# Patient Record
Sex: Male | Born: 1940 | Race: White | Hispanic: No | Marital: Married
Health system: Southern US, Community
[De-identification: ages and names within clinical notes are randomized; demographics above are authoritative.]

---

## 2004-01-10 ENCOUNTER — Encounter: Payer: Self-pay | Admitting: Internal Medicine

## 2004-01-26 ENCOUNTER — Ambulatory Visit: Payer: Self-pay

## 2004-02-10 ENCOUNTER — Encounter: Payer: Self-pay | Admitting: Internal Medicine

## 2004-03-02 ENCOUNTER — Ambulatory Visit: Payer: Self-pay

## 2004-03-11 ENCOUNTER — Encounter: Payer: Self-pay | Admitting: Internal Medicine

## 2004-03-19 ENCOUNTER — Ambulatory Visit: Payer: Self-pay | Admitting: Podiatry

## 2004-04-11 ENCOUNTER — Encounter: Payer: Self-pay | Admitting: Internal Medicine

## 2004-05-20 ENCOUNTER — Ambulatory Visit: Payer: Self-pay

## 2005-06-14 ENCOUNTER — Ambulatory Visit: Payer: Self-pay | Admitting: Specialist

## 2005-09-08 ENCOUNTER — Ambulatory Visit: Payer: Self-pay | Admitting: Specialist

## 2005-09-15 ENCOUNTER — Ambulatory Visit: Payer: Self-pay | Admitting: Internal Medicine

## 2005-10-31 ENCOUNTER — Ambulatory Visit: Payer: Self-pay | Admitting: Otolaryngology

## 2005-11-08 ENCOUNTER — Inpatient Hospital Stay: Payer: Self-pay | Admitting: Otolaryngology

## 2006-01-26 ENCOUNTER — Inpatient Hospital Stay: Payer: Self-pay | Admitting: Internal Medicine

## 2006-01-26 ENCOUNTER — Other Ambulatory Visit: Payer: Self-pay

## 2006-03-13 ENCOUNTER — Encounter: Payer: Self-pay | Admitting: Specialist

## 2006-04-11 ENCOUNTER — Encounter: Payer: Self-pay | Admitting: Specialist

## 2006-05-12 ENCOUNTER — Encounter: Payer: Self-pay | Admitting: Specialist

## 2006-07-12 ENCOUNTER — Encounter: Payer: Self-pay | Admitting: Specialist

## 2006-08-07 ENCOUNTER — Encounter: Payer: Self-pay | Admitting: Internal Medicine

## 2006-08-10 ENCOUNTER — Encounter: Payer: Self-pay | Admitting: Internal Medicine

## 2006-08-14 ENCOUNTER — Encounter: Payer: Self-pay | Admitting: Specialist

## 2006-09-10 ENCOUNTER — Encounter: Payer: Self-pay | Admitting: Specialist

## 2006-10-10 ENCOUNTER — Encounter: Payer: Self-pay | Admitting: Specialist

## 2006-11-10 ENCOUNTER — Encounter: Payer: Self-pay | Admitting: Specialist

## 2006-12-11 ENCOUNTER — Encounter: Payer: Self-pay | Admitting: Specialist

## 2007-03-15 ENCOUNTER — Other Ambulatory Visit: Payer: Self-pay

## 2007-03-15 ENCOUNTER — Ambulatory Visit: Payer: Self-pay | Admitting: Urology

## 2007-03-22 ENCOUNTER — Ambulatory Visit: Payer: Self-pay | Admitting: Urology

## 2007-04-17 ENCOUNTER — Ambulatory Visit: Payer: Self-pay | Admitting: Podiatry

## 2008-02-12 ENCOUNTER — Ambulatory Visit: Payer: Self-pay | Admitting: Family Medicine

## 2008-12-25 ENCOUNTER — Ambulatory Visit: Payer: Self-pay | Admitting: Rheumatology

## 2009-01-06 ENCOUNTER — Ambulatory Visit: Payer: Self-pay | Admitting: Rheumatology

## 2009-03-18 ENCOUNTER — Ambulatory Visit: Payer: Self-pay | Admitting: Podiatry

## 2009-03-27 ENCOUNTER — Inpatient Hospital Stay: Payer: Self-pay | Admitting: Podiatry

## 2009-04-01 ENCOUNTER — Encounter: Payer: Self-pay | Admitting: Internal Medicine

## 2009-04-11 ENCOUNTER — Encounter: Payer: Self-pay | Admitting: Internal Medicine

## 2009-05-12 ENCOUNTER — Encounter: Payer: Self-pay | Admitting: Internal Medicine

## 2009-12-17 ENCOUNTER — Ambulatory Visit: Payer: Self-pay | Admitting: Urology

## 2010-09-13 ENCOUNTER — Ambulatory Visit: Payer: Self-pay | Admitting: Internal Medicine

## 2010-11-03 ENCOUNTER — Encounter: Payer: Self-pay | Admitting: Internal Medicine

## 2010-11-10 ENCOUNTER — Encounter: Payer: Self-pay | Admitting: Internal Medicine

## 2010-11-18 ENCOUNTER — Ambulatory Visit: Payer: Self-pay | Admitting: Urology

## 2010-11-29 ENCOUNTER — Ambulatory Visit: Payer: Self-pay | Admitting: Urology

## 2010-12-06 ENCOUNTER — Ambulatory Visit: Payer: Self-pay | Admitting: Radiation Oncology

## 2010-12-11 ENCOUNTER — Ambulatory Visit: Payer: Self-pay | Admitting: Radiation Oncology

## 2010-12-11 ENCOUNTER — Encounter: Payer: Self-pay | Admitting: Internal Medicine

## 2010-12-27 ENCOUNTER — Ambulatory Visit: Payer: Self-pay | Admitting: Radiation Oncology

## 2011-01-10 ENCOUNTER — Ambulatory Visit: Payer: Self-pay | Admitting: Radiation Oncology

## 2011-01-12 ENCOUNTER — Ambulatory Visit: Payer: Self-pay | Admitting: Gastroenterology

## 2011-02-10 ENCOUNTER — Ambulatory Visit: Payer: Self-pay | Admitting: Radiation Oncology

## 2011-03-12 ENCOUNTER — Ambulatory Visit: Payer: Self-pay | Admitting: Radiation Oncology

## 2011-03-12 ENCOUNTER — Ambulatory Visit: Payer: Self-pay | Admitting: Internal Medicine

## 2011-04-04 ENCOUNTER — Inpatient Hospital Stay: Payer: Self-pay | Admitting: Internal Medicine

## 2011-04-12 LAB — BASIC METABOLIC PANEL
Anion Gap: 10 (ref 7–16)
BUN: 23 mg/dL — ABNORMAL HIGH (ref 7–18)
Calcium, Total: 8.4 mg/dL — ABNORMAL LOW (ref 8.5–10.1)
Chloride: 99 mmol/L (ref 98–107)
Co2: 31 mmol/L (ref 21–32)
EGFR (African American): >60
Osmolality: 288 (ref 275–301)
Potassium: 4.5 mmol/L (ref 3.5–5.1)

## 2011-04-13 LAB — CBC WITH DIFFERENTIAL/PLATELET
Basophil #: 0.1 10*3/uL (ref 0.0–0.1)
Eosinophil %: 0 %
Lymphocyte #: 0.6 10*3/uL — ABNORMAL LOW (ref 1.0–3.6)
Lymphocyte %: 3.2 %
MCV: 86 fL (ref 80–100)
Monocyte %: 2.6 %
Neutrophil %: 93.8 %
Platelet: 268 10*3/uL (ref 150–440)
RBC: 4.31 10*6/uL — ABNORMAL LOW (ref 4.40–5.90)
WBC: 18.2 10*3/uL — ABNORMAL HIGH (ref 3.8–10.6)

## 2011-04-13 LAB — BASIC METABOLIC PANEL
Anion Gap: 9 (ref 7–16)
BUN: 24 mg/dL — ABNORMAL HIGH (ref 7–18)
Calcium, Total: 8.6 mg/dL (ref 8.5–10.1)
Chloride: 98 mmol/L (ref 98–107)
Creatinine: 0.68 mg/dL (ref 0.60–1.30)
EGFR (African American): >60
Glucose: 168 mg/dL — ABNORMAL HIGH (ref 65–99)
Osmolality: 282 (ref 275–301)
Potassium: 4.6 mmol/L (ref 3.5–5.1)
Sodium: 137 mmol/L (ref 136–145)

## 2011-05-12 ENCOUNTER — Ambulatory Visit: Payer: Self-pay | Admitting: Radiation Oncology

## 2011-05-13 ENCOUNTER — Ambulatory Visit: Payer: Self-pay | Admitting: Internal Medicine

## 2011-09-16 ENCOUNTER — Ambulatory Visit: Payer: Self-pay | Admitting: Radiation Oncology

## 2011-10-10 ENCOUNTER — Ambulatory Visit: Payer: Self-pay | Admitting: Radiation Oncology

## 2011-11-16 ENCOUNTER — Ambulatory Visit: Payer: Self-pay | Admitting: Internal Medicine

## 2012-01-27 ENCOUNTER — Ambulatory Visit: Payer: Self-pay | Admitting: Specialist

## 2012-03-16 ENCOUNTER — Ambulatory Visit: Payer: Self-pay | Admitting: Radiation Oncology

## 2012-03-17 LAB — PSA: PSA: 1.4 ng/mL (ref 0.0–4.0)

## 2012-04-11 ENCOUNTER — Ambulatory Visit: Payer: Self-pay | Admitting: Radiation Oncology

## 2012-07-24 ENCOUNTER — Encounter: Payer: Self-pay | Admitting: Sports Medicine

## 2012-08-09 ENCOUNTER — Encounter: Payer: Self-pay | Admitting: Sports Medicine

## 2012-09-09 ENCOUNTER — Encounter: Payer: Self-pay | Admitting: Sports Medicine

## 2012-09-13 ENCOUNTER — Ambulatory Visit: Payer: Self-pay | Admitting: Radiation Oncology

## 2013-02-09 DEATH — deceased

## 2014-08-03 NOTE — Consult Note (Signed)
PATIENT NAME:  Glenn Allen, Glenn Allen MR#:  [id number] DATE OF BIRTH:  Feb 09, 1941  DATE OF CONSULTATION:  04/06/2011  REFERRING PHYSICIAN:   CONSULTING PHYSICIAN:  Lamar BlinksBruce J. Leshea Jaggers, MD  REASON FOR CONSULTATION: Acute on chronic congestive heart failure.   CHIEF COMPLAINT: "I am short of breath."   HISTORY OF PRESENT ILLNESS: This is a 74 year old male with known severe chronic obstructive pulmonary disease as well as sleep apnea who has had a previous cerebrovascular accident in the past. He has had BiPAP use as well as hyperlipidemia treatment with Zocor. He had multiple exacerbations of chronic obstructive pulmonary disease and has been admitted to the hospital for this issue. During this period of time he had consolidation of the right lower lobe pneumonia and has had some improvements although last night had an exacerbation with hypoxia. He was required to have BiPAP use and did improve after intravenous Lasix as well. He does have some improvement today with some improvements of his pulmonary edema. The patient has had minimal elevation of troponin most consistent with ischemic demand rather than acute myocardial infarction. The patient has had normal sinus rhythm and no other rhythm disturbances at this time.   REVIEW OF SYSTEMS: Remainder review of systems negative for vision change, ringing in the ears, hearing loss, cough, congestion, heartburn, nausea, vomiting, diarrhea, bloody stools, stomach pain, extremity pain, leg weakness, cramping of the buttocks, known blood clots, headaches, blackouts, dizzy spells, nosebleed, congestion, trouble swallowing, frequent urination, urination at night, muscle weakness, numbness, anxiety, depression.    PAST MEDICAL HISTORY:  1. Cerebrovascular accident.  2. Hypertension.  3. Chronic obstructive pulmonary disease.  4. Sleep apnea.   FAMILY HISTORY: Positive for multiple family members with early onset of cardiovascular disease.   SOCIAL HISTORY: Currently  denies alcohol or tobacco use.   ALLERGIES: No known drug allergies.   CURRENT MEDICATIONS: As listed.   PHYSICAL EXAMINATION:  VITAL SIGNS: Blood pressure 136/68 bilaterally, heart rate 70 upright, reclining, and regular.   GENERAL: He is a well appearing male in no acute distress.   HEENT: No icterus, thyromegaly, ulcers, hemorrhage, or xanthelasma.   CARDIOVASCULAR: Regular rate and rhythm with normal S1, distant S2. Point of maximal impulse is diffuse. Carotid upstroke normal without bruit. Jugular venous pressure normal.   LUNGS: Lungs have diffuse expiratory wheezes and rhonchi.   ABDOMEN: Soft, nontender without hepatosplenomegaly or masses. Abdominal aorta is normal size without bruit.   EXTREMITIES: 2+ bilateral pulses in dorsal, pedal, radial, and femoral arteries without lower extremity edema, cyanosis, clubbing, ulcers.   NEUROLOGIC: He is oriented to time, place, and person with normal mood and affect.   ASSESSMENT: 74 year old male with acute on chronic obstructive pulmonary disease, sleep apnea with acute congestive heart failure with pulmonary edema, hypertension, hyperlipidemia.   RECOMMENDATIONS:  1. Continue intravenous Lasix. 2. BiPAP for hypoxia.  3. Continue antibiotics and inhalers as well as prednisone for pneumonia.  4. Echocardiogram to assess LV systolic dysfunction, valvular heart disease.  5. Further cardiac diagnostics after above.  ____________________________ Lamar BlinksBruce J. Shakiyla Kook, MD bjk:cms D: 04/07/2011 08:03:04 ET T: 04/07/2011 10:06:32 ET  JOB#: [id number] cc: Lamar BlinksBruce J. Trystin Terhune, MD, <Dictator> Lamar BlinksBRUCE J Savian Mazon MD ELECTRONICALLY SIGNED 04/18/2011 9:47

## 2014-08-03 NOTE — Discharge Summary (Signed)
PATIENT NAME:  Glenn Allen, [name]:  [id number] DATE OF BIRTH:  [birth year]  DATE OF ADMISSION:  04/04/2011 DATE OF DISCHARGE:  04/15/2011  HISTORY: Mr. Glenn Allen is a 74 year old male followed in our office. He presented himself to the Emergency Room with lethargy and generalized weakness. He was found to be hypoxic with an oxygen saturation of 88 and a temperature of 102.   The patient had underlying rheumatoid arthritis, chronic obstructive pulmonary disease, hypertensive cardiovascular disease, history of prostate cancer, depression, and a previous stroke syndrome leaving him with some cognitive loss. The patient had been on an outpatient regimen of azithromycin for bronchitis-like syndrome. His x-ray was compatible with pneumonia with underlying chronic obstructive pulmonary disease. The patient was treated with broad-spectrum antibiotics and IV steroids with only modest improvement. He got into severe respiratory distress being required to be moved to the Intensive Care Unit. He responded to IV Lasix with improvement in his oxygen saturation. His echocardiogram showed that he had a significant valvular cardiomyopathy with an ejection fraction of around 30%.   He had earlier on been seen in consultation by Dr. Meredeth IdeFleming, pulmonologist. He was then seen by Dr. Gwen PoundsKowalski, cardiologist.   Blood cultures, urine cultures, etc. did not grow out any organisms. His x-rays were compatible with congestive heart failure. He had a CT scan to be sure he did not have a pulmonary embolus. He did have some bilateral pleural effusions. There was some persistent right lobe atelectasis, possible pneumonia.   During the hospitalization and ambulation, it was found his oxygen level continued to drop. With ambulation, got down in the low 80s with slow response to improvement. His vital signs remained stable. Blood pressure 144/85. At the time of discharge, he was afebrile. Pulse was running in the 60 range.   During the  hospitalization he was put on Coreg 6.25 mg twice a day. He had been on Lopressor on admission. This was held due to his low blood pressure. This was expected to be restarted once he becomes more ambulatory. The antibiotics continue to be azithromycin for the loading dose and was continued on Rocephin. This was followed by chest x-ray on 01/03 which showed the lung fields to be clear. There was some pleural thickening, possible some small loculated fluid posteriorly. No pneumonia. No pulmonary edema. On telemetry, he remained in sinus mechanism. He had some slowing of his heart rate in the 50s at night. He did have some PACs and some short runs of supraventricular tachycardia, none clinically symptomatic.   DISCHARGE DIAGNOSES:  1. Acute on chronic respiratory failure, probable pneumonia. 2. Sleep apnea syndrome.  3. Valvular cardiomyopathy.  4. Rheumatoid arthritis. 5. Diabetes. 6. Prostate cancer.  7. Hyperlipidemia.  8. Parkinson's syndrome.   DISCHARGE MEDICATIONS:  1. Aspirin 81 mg daily.  2. Budesonide inhaler with Perforomist mixed together, which he was taking prior to admission twice a day.  3. Continue his eye medications which are Alphagan and Lumigan.  4. Continue his Paroxetine 20 mg daily for his depression.  5. Continue his simvastatin 20 mg a day for hyperlipidemia.  6. Continue VESIcare 10 mg daily for his bladder symptoms. 7. Continue his carbidopa/levodopa 25/100, three times daily for his Parkinson's syndrome. 8. Continue his hydroxychloroquine 20 mg daily for his rheumatoid arthritis.   ADDITIONAL MEDICATIONS: 1. Protonix 40 mg daily as for protection related to his steroids.  2. Nystatin powder to the groins 3 times daily for fungal infections.  3. Regular insulin, check his sugars twice a  day. He has a sliding scale to use. 4. Colace 100 mg daily.  5. Glycerin suppository daily for constipation. 6. Milk of magnesia as needed for constipation.  7. New medication,  Carvedilol 3.125 mg b.i.d.  8. Spiriva inhaler, new, for his lungs once nightly.  9. Lasix 20 mg daily for fluid retention. 10. He is on a prednisone taper starting at 50 mg with a 5 mg decrease dose per day to discontinue. He has some DuoNebs which he can use p.r.n. every six hours for acute shortness of breath and wheezing.   DISCHARGE INSTRUCTIONS: 1. We will hold his lisinopril at this time until further stabilization of his blood pressure. He had been on 20 mg daily.  2. He will have home health to check on him, home physical therapy to continue to work with him. 3. He will have home oxygen which he is to use at night and while ambulating with a portable tank. Hopefully, this will stabilize and not be an ongoing issue for him.  4. He will need a follow-up appointment with his cardiologist, new for him, Dr. Gwen Pounds, after he is seen by his primary care doctor, Dr. Candelaria Stagers, in a couple of weeks.  5. He will be returned to the care of his pulmonologist, Dr. Meredeth Ide, and to his rheumatologist, Dr. Lavenia Atlas.   OVERALL PROGNOSIS: Guarded.   DIET: He will be on a carbohydrate low sodium diet.   ____________________________ [name]. Candelaria Stagers, MD dcc:ap D: 04/15/2011 10:56:24 ET T: 04/15/2011 11:21:14 ET JOB#: [id number]  cc: Gael Delude C. Candelaria Stagers, MD, <Dictator> Virl Axe MD ELECTRONICALLY SIGNED 04/15/2011 16:06

## 2014-08-03 NOTE — Discharge Summary (Signed)
PATIENT NAME:  Glenn Allen, Glenn Allen MR#:  [id number] DATE OF BIRTH:  [birth year]  DATE OF ADMISSION:  04/04/2011 DATE OF DISCHARGE:  04/15/2011  ADDENDUM:  Correction to discharge summary.  In the discharge summary, at the bottom of page one, there is a section that says "during the hospitalization he was put on Coreg 6.25 mg twice a day. He had been on Lopressor on admission". The word "Lopressor" needs to be replaced with the word Lisinopril.   ADDITIONAL DISCHARGE DIAGNOSIS: Acute systolic congestive heart failure.  ____________________________ Jimmie Mollyon C. Candelaria Stagershaplin, MD dcc:slb D: 04/18/2011 09:25:00 ET T: 04/18/2011 09:38:37 ET JOB#: [id number]  cc: Aamani Moose C. Candelaria Stagershaplin, MD, <Dictator> Virl AxeN C Devri Kreher MD ELECTRONICALLY SIGNED 04/18/2011 11:43

## 2014-08-03 NOTE — H&P (Signed)
PATIENT NAME:  Glenn Allen, Darran MR#:  [id number] DATE OF BIRTH:  [birth year]  DATE OF ADMISSION:  04/04/2011  PRIMARY CARE PHYSICIAN:  Dr. Conchita Parison Chaplin ER PHYSICIAN: Dr. Mindi JunkerGottlieb   CHIEF COMPLAINT: Lethargy along with generalized weakness.   HISTORY OF PRESENT ILLNESS: The patient is a 74 year old male patient with history of prostate cancer, hypertension, history of cerebrovascular accident, chronic obstructive pulmonary disease, depression, and also having foot problems brought in by the wife because he was not feeling well since this morning. The patient was very weak and lethargic. This afternoon the patient complained of some fever and weakness. By evening the patient was unable to get up and walk with decreased urine output so the family brought him here. He was treated for bronchitis with antibiotics recently two weeks ago. In the ER he was found to have pneumonia on the right side with hypoxia, saturations 88% on room air and temperature of 102. The patient's wife says that he was treated with antibiotics for pneumonia. This time he did not have any cough or trouble breathing but generalized body aches with fever and chills are present.   PAST MEDICAL HISTORY:  1. Hypertension.  2. Rheumatid arthritis.  3. History of prostate cancer finished radiation therapy recently.  He sees Dr. Rushie Chestnuthrystal. 4. History of skin cancer status post biopsy.  5. Depression.  6. History of chronic obstructive pulmonary disease.   PAST SURGICAL HISTORY:  1. History of surgery for the right foot.  2. History of bladder stone removal. 3. Cataract removal.   ALLERGIES: The patient has no known allergies.   SOCIAL HISTORY: Occasional alcohol. No smoking and no drugs.   FAMILY HISTORY: No deep vein thrombosis. No pulmonary embolus.   REVIEW OF SYSTEMS:  Right now he is sleeping.  Denies any complaints except as documented in the history of present illness.   MEDICATIONS: 1. Vesicare 5 mg p.o. daily.   2. Simvastatin 20 mg p.o. daily.  3. Perforomist  20 mcg two times in nebulizer. 4. Paroxetine 20 mg p.o. daily.  5. Multivitamin 1 tablet p.o. daily.  6. Lumigan one drop in each eye. 7. Hydroxychloroquine 200 mg daily. 8. Calcium 500 mg once a day. 9. Budesonide 0.5 mg b.i.d.  10. Aspirin 81 mg daily. 11. Alphagan 0.1, one drop in the morning.  PHYSICAL EXAMINATION:  GENERAL: This is a 74 year old male in distress because of fever.  VITAL SIGNS:  Temperature 102.3, pulse 90, respirations 20-25, blood pressure 142/73. Initial sats 88%.  Now on 2 liters saturations went up to 95%.   GENERAL:  The patient is alert and oriented.  HEENT: Head atraumatic, normocephalic. Pupils are equally reacting to light. Extraocular movements intact. ENT: No tympanic membrane congestion. No turbinate hypertrophy. No oropharyngeal erythema.   NECK: Normal range of motion. No jugular venous distention. No carotid bruits.   CARDIOVASCULAR: S1, S2 regular. No murmurs.  LUNGS: The patient does have decreased breath sounds on the right side and some wheezing  on the right.   ABDOMEN: Soft, nontender, nondistended. Bowel sounds present.   EXTREMITIES: No extremity edema. No cyanosis. No clubbing.   SKIN: Warm and dry.   NEUROLOGIC: No focal neurological deficit.   LABS/STUDIES: Electrolytes: Sodium 135, potassium 4.4, chloride 97, bicarbonate 27, BUN 18, creatinine 1.1, glucose 185. Liver functions within normal limits. WBC 15.7, hemoglobin 13.3, hematocrit 40.7, platelets 239.  Influenza titers are negative. Troponin less than 0.02. CPK-MB less than 0.5. CK total 108. Chest x-ray showed right-sided infiltrate with  right pleural effusion. The patient's EKG showed normal sinus rhythm with heart rate 91 beats per minute. No ST-T changes.   ASSESSMENT AND PLAN:   1. This is a 74 year old male patient with respiratory distress and community-acquired pneumonia. Admit to the hospitalist service on telemetry.  We will give Rocephin and Zithromax along with oxygen and also the patient has a high fever. He received Tylenol. We will use Tylenol and Motrin alternatively for fever. The patient already received blood cultures in the ER and also hydrated with normal saline 100 mL an hour. He already received 500 mL of normal saline in the ER. 2. Decreased urine output, likely because of fever.  Monitor urine output closely and send urine for culture.  Hydrate him. 3. History of rheumatoid arthritis.  Continue hydroxychloroquine.  4. Hypertension. Blood pressure is stable.  5. History of prostate cancer with radiation therapy. Stable.  6. Depression. He is on Paxil. 7. History of chronic obstructive pulmonary disease. He is on nebulizers. We ordered them.  Right now we will continue nebulizer, oxygen.  I do not see any indication for steroids at this time.  8. History of cerebrovascular accident. The patient uses a cane at home.   We will sign off to [facility].        TOTAL TIME SPENT ON HISTORY AND PHYSICAL:  60 minutes.      ____________________________ Katha Hamming, MD sk:bjt D: 04/04/2011 21:48:31 ET T: 04/05/2011 12:47:48 ET JOB#: [id number]  cc: Katha Hamming, MD, <Dictator> [name]. Candelaria Stagers, MD Katha Hamming MD ELECTRONICALLY SIGNED 04/17/2011 21:46

## 2014-08-18 IMAGING — CT CT CHEST W/O CM
2 of 4 series · 16 of 32 positions shown, 19 images · non-contrast
Comparison: none

REASON FOR EXAM: pulmonary nodule on chest XR
COMMENTS:

[Series 2: chest w/o 3.0 i31f 2 · axial · non-contrast · 0.81mm/px · z∈[-227,+4]mm · 9 of 99 slices shown, 12 images]
[im 11/99  mediastinal]
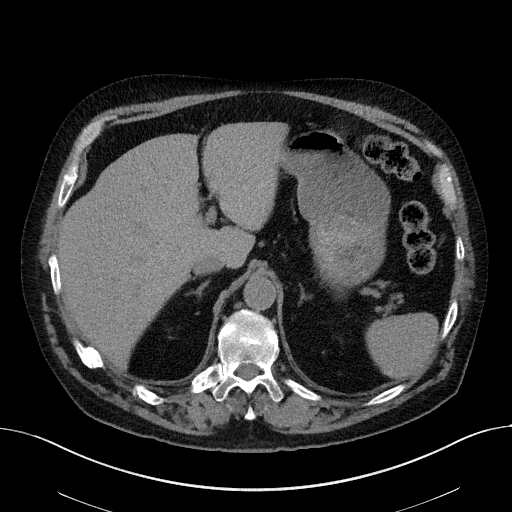
[im 11/99  lung]
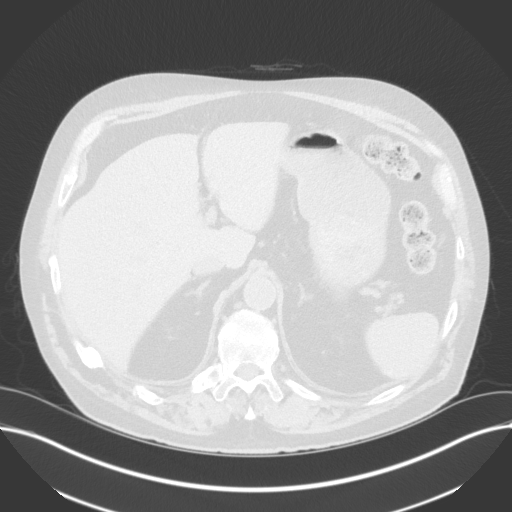
[im 22/99  lung]
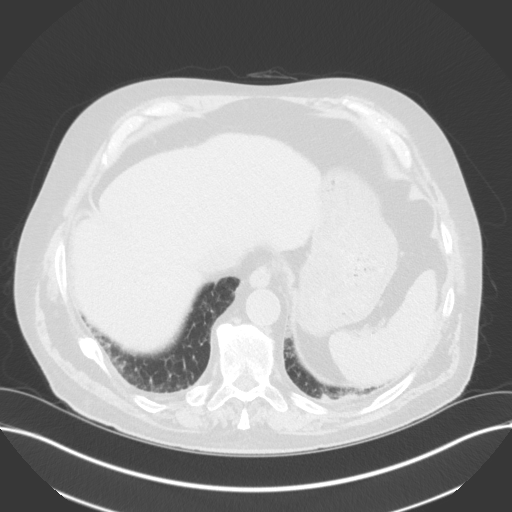
[im 33/99  lung]
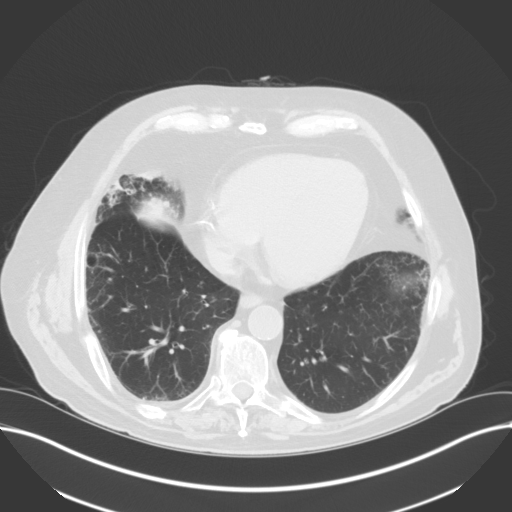
[im 44/99  lung]
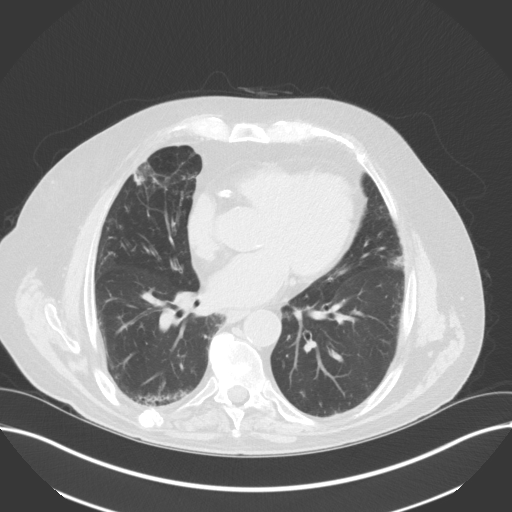
[im 47/99  mediastinal]
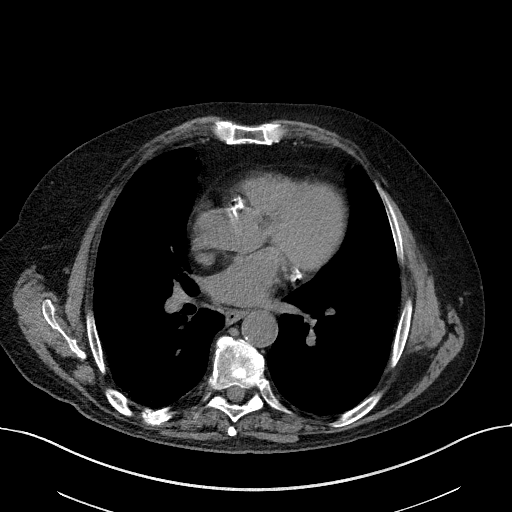
[im 47/99  lung]
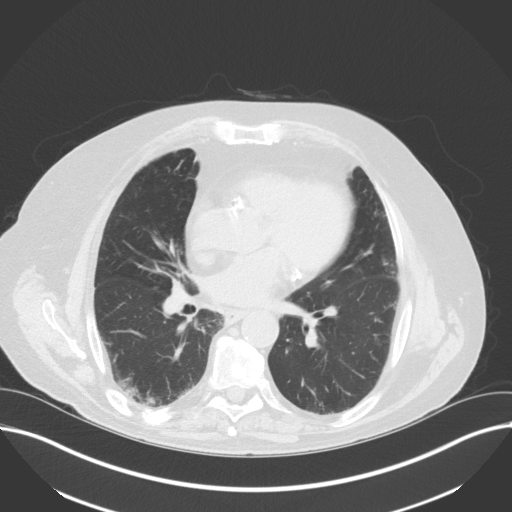
[im 55/99  lung]
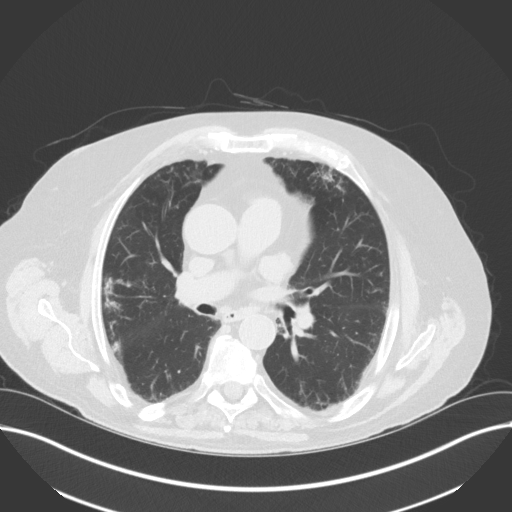
[im 66/99  lung]
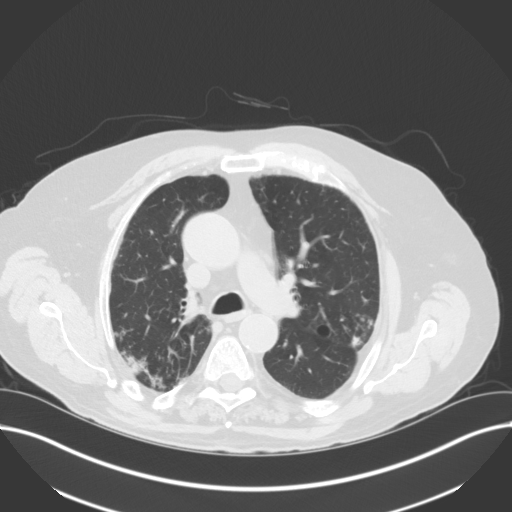
[im 77/99  lung]
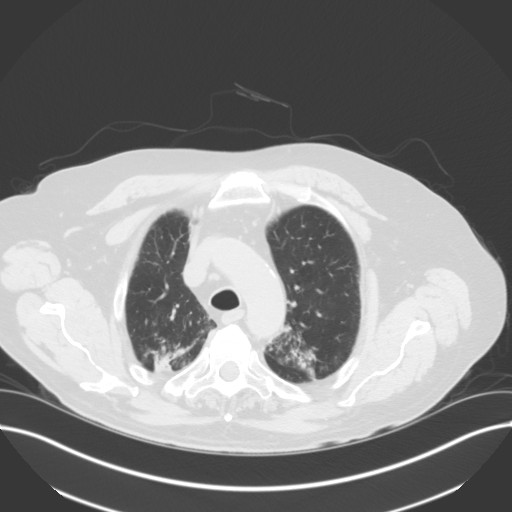
[im 88/99  mediastinal]
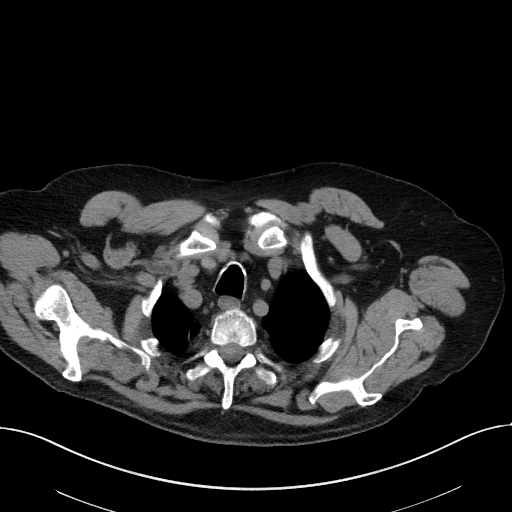
[im 88/99  lung]
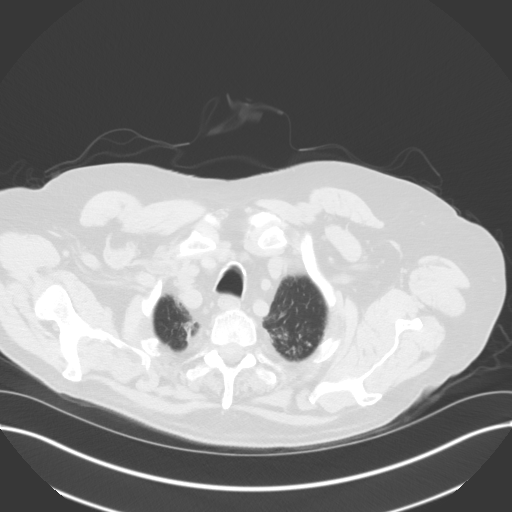

[Series 3: lung · axial · 0.81mm/px · z∈[-227,-29]mm · 7 of 99 slices shown]
[im 11/99  lung]
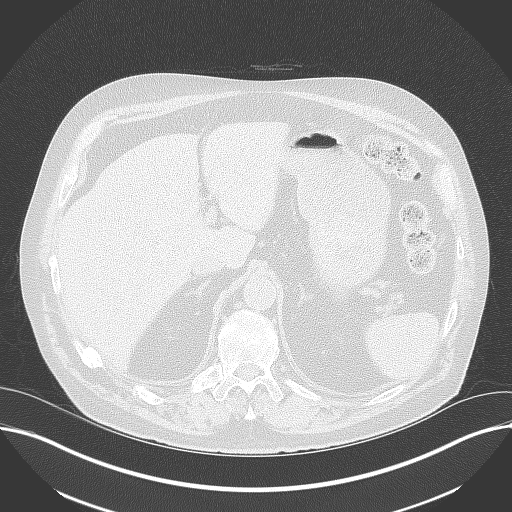
[im 22/99  lung]
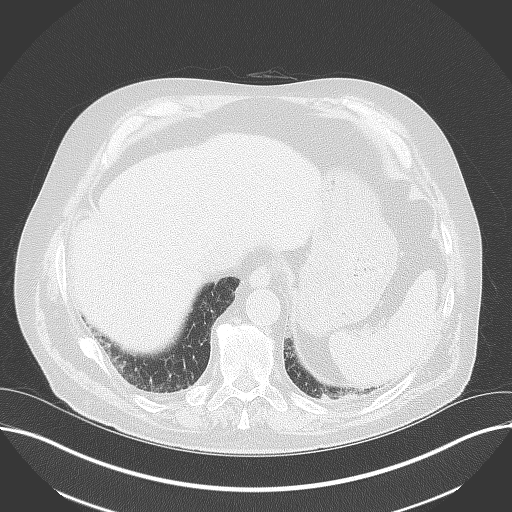
[im 33/99  lung]
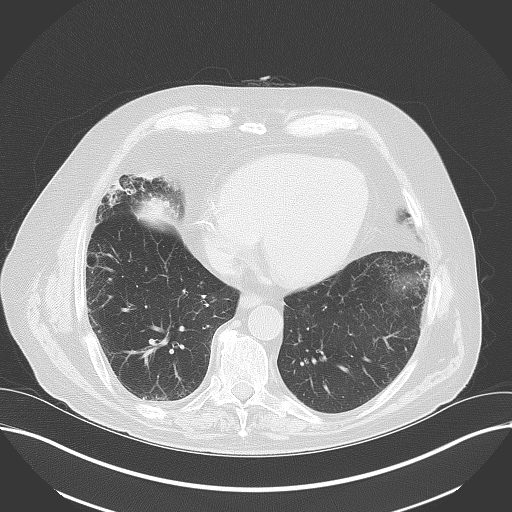
[im 44/99  lung]
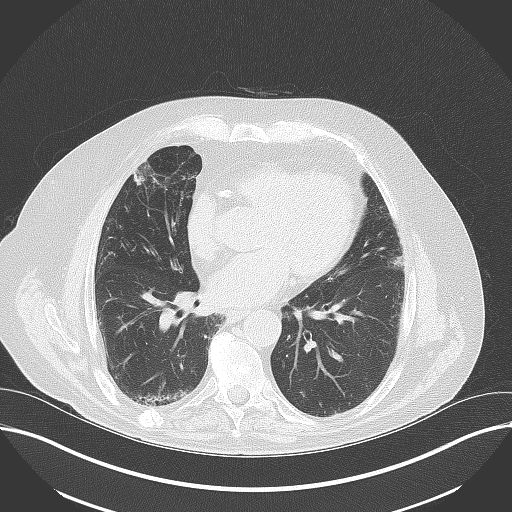
[im 55/99  lung]
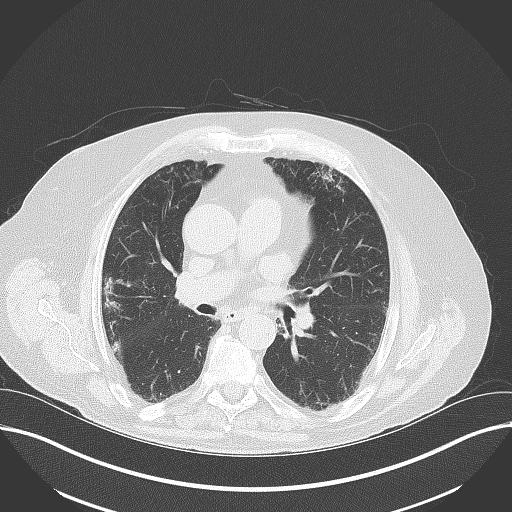
[im 66/99  lung]
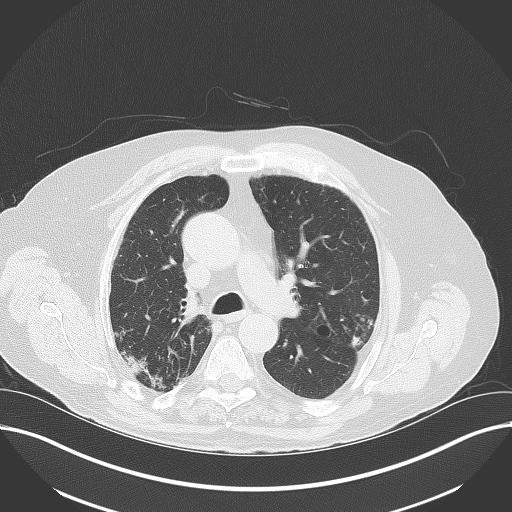
[im 77/99  lung]
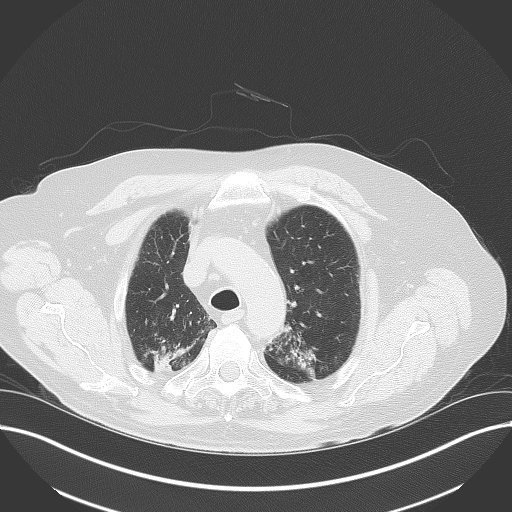

[16 of 32 positions shown; findings below may reference images not displayed]

PROCEDURE:     KCT - KCT CHEST WITHOUT CONTRAST  - January 27, 2012 [DATE]

RESULT:     Axial noncontrast CT scanning was performed through the chest
with reconstructions at 3 mm intervals and slice thicknesses. Review of
multiplanar reconstructed images was performed separately on the VIA
monitor. Comparison is made to the study March 23, 2011.

At lung window settings there are patchy areas of predominantly subpleural
increased density in both lungs superimposed upon a background of COPD.
These findings have improved since the previous study. There is no dominant
mass. There are a few areas of nodularity measuring under 5 mm in diameter
in both lungs.

At mediastinal window settings old rib fractures on the right are present.
The cardiac chambers are top normal in size. There are coronary artery
calcifications. The caliber of the thoracic aorta is normal. There are no
bulky mediastinal or hilar lymph nodes. There is no pleural nor pericardial
effusion.

Within the upper abdomen the observed portions of the liver and spleen and
adrenal glands appear normal. There is a nonobstructing stone in the upper
pole of the left kidney.
IMPRESSION: 1. There are emphysematous and fibrotic changes in both lungs which are much
improved over the previous study. Previously demonstrated bilateral pleural
effusions have resolved. A few subcentimeter nodules are present in both
lungs which are not clearly new and are likely inflammatory in nature.
2. There is no mediastinal or hilar lymphadenopathy.
3. There is no evidence of CHF. There are coronary artery calcifications.
4. Multiple old rib fractures on the right are present posteriorly.

[REDACTED]
